# Patient Record
Sex: Female | Born: 2000 | Hispanic: Yes | Marital: Single | Smoking: Never smoker
Health system: Southern US, Community
[De-identification: ages and names within clinical notes are randomized; demographics above are authoritative.]

---
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
## 2018-05-14 ENCOUNTER — Emergency Department (HOSPITAL_COMMUNITY)
Admission: EM | Admit: 2018-05-14 | Discharge: 2018-05-15 | Disposition: A | Discharge status: Home / Self Care | Payer: Self-pay | Attending: Emergency Medicine | Admitting: Emergency Medicine

## 2018-05-14 ENCOUNTER — Encounter (HOSPITAL_COMMUNITY): Payer: Self-pay

## 2018-05-14 DIAGNOSIS — Y929 Unspecified place or not applicable: Secondary | ICD-10-CM | POA: Insufficient documentation

## 2018-05-14 DIAGNOSIS — Y999 Unspecified external cause status: Secondary | ICD-10-CM | POA: Insufficient documentation

## 2018-05-14 DIAGNOSIS — S06340A Traumatic hemorrhage of right cerebrum without loss of consciousness, initial encounter: Secondary | ICD-10-CM | POA: Insufficient documentation

## 2018-05-14 DIAGNOSIS — X58XXXA Exposure to other specified factors, initial encounter: Secondary | ICD-10-CM | POA: Insufficient documentation

## 2018-05-14 DIAGNOSIS — Y939 Activity, unspecified: Secondary | ICD-10-CM | POA: Insufficient documentation

## 2018-05-14 DIAGNOSIS — IMO0001 Reserved for inherently not codable concepts without codable children: Secondary | ICD-10-CM

## 2018-05-14 NOTE — ED Triage Notes (Signed)
Onset 3 days headache, decreased appetite. Pt has been taking Tylenol and Ibuprofen with no relief.  Sensitive to light.

## 2018-05-15 ENCOUNTER — Emergency Department (HOSPITAL_COMMUNITY): Payer: Self-pay

## 2018-05-15 LAB — CBC WITH DIFFERENTIAL/PLATELET
Abs Immature Granulocytes: 0.03 10*3/uL (ref 0.00–0.07)
Basophils Absolute: 0 10*3/uL (ref 0.0–0.1)
Basophils Relative: 0 %
Eosinophils Absolute: 0 10*3/uL (ref 0.0–0.5)
Eosinophils Relative: 0 %
HCT: 36.2 % (ref 36.0–46.0)
Hemoglobin: 11.4 g/dL — ABNORMAL LOW (ref 12.0–15.0)
Immature Granulocytes: 0 %
Lymphocytes Relative: 24 %
Lymphs Abs: 2 10*3/uL (ref 0.7–4.0)
MCH: 25.9 pg — ABNORMAL LOW (ref 26.0–34.0)
MCHC: 31.5 g/dL (ref 30.0–36.0)
MCV: 82.3 fL (ref 80.0–100.0)
Monocytes Absolute: 0.6 10*3/uL (ref 0.1–1.0)
Monocytes Relative: 7 %
Neutro Abs: 5.8 10*3/uL (ref 1.7–7.7)
Neutrophils Relative %: 69 %
Platelets: 287 10*3/uL (ref 150–400)
RBC: 4.4 MIL/uL (ref 3.87–5.11)
RDW: 13.1 % (ref 11.5–15.5)
WBC: 8.4 10*3/uL (ref 4.0–10.5)
nRBC: 0 % (ref 0.0–0.2)

## 2018-05-15 LAB — BASIC METABOLIC PANEL
Anion gap: 7 (ref 5–15)
BUN: 7 mg/dL (ref 6–20)
CO2: 20 mmol/L — ABNORMAL LOW (ref 22–32)
Calcium: 8.5 mg/dL — ABNORMAL LOW (ref 8.9–10.3)
Chloride: 109 mmol/L (ref 98–111)
Creatinine, Ser: 0.62 mg/dL (ref 0.44–1.00)
GFR calc Af Amer: >60 mL/min (ref >60)
GFR calc non Af Amer: >60 mL/min (ref >60)
Glucose, Bld: 97 mg/dL (ref 70–99)
Potassium: 3.5 mmol/L (ref 3.5–5.1)
Sodium: 136 mmol/L (ref 135–145)

## 2018-05-15 MED ORDER — IOPAMIDOL (ISOVUE-370) INJECTION 76%
50.0000 mL | Freq: Once | INTRAVENOUS | Status: AC | PRN
  Administered 2018-05-15: 50 mL via INTRAVENOUS

## 2018-05-15 MED ORDER — DIPHENHYDRAMINE HCL 50 MG/ML IJ SOLN
25.0000 mg | Freq: Once | INTRAMUSCULAR | Status: AC
  Administered 2018-05-15: 25 mg via INTRAVENOUS
  Filled 2018-05-15: qty 1

## 2018-05-15 MED ORDER — SODIUM CHLORIDE 0.9 % IV BOLUS
1000.0000 mL | Freq: Once | INTRAVENOUS | Status: AC
  Administered 2018-05-15: 1000 mL via INTRAVENOUS

## 2018-05-15 MED ORDER — IOPAMIDOL (ISOVUE-370) INJECTION 76%
INTRAVENOUS | Status: AC
  Filled 2018-05-15: qty 50

## 2018-05-15 MED ORDER — METOCLOPRAMIDE HCL 5 MG/ML IJ SOLN
10.0000 mg | Freq: Once | INTRAMUSCULAR | Status: AC
  Administered 2018-05-15: 10 mg via INTRAVENOUS
  Filled 2018-05-15: qty 2

## 2018-05-15 MED ORDER — KETOROLAC TROMETHAMINE 15 MG/ML IJ SOLN
15.0000 mg | Freq: Once | INTRAMUSCULAR | Status: AC
  Administered 2018-05-15: 15 mg via INTRAVENOUS
  Filled 2018-05-15: qty 1

## 2018-05-15 NOTE — ED Notes (Signed)
Pt returned from CT, reports HA continues to relieved.

## 2018-05-15 NOTE — ED Provider Notes (Signed)
[facility] EMERGENCY DEPARTMENT Provider Note   CSN: [id number] Arrival date & time: 05/14/18  1649     History   Chief Complaint Chief Complaint  Patient presents with  . Headache    HPI Emma Morrow is a 18 y.o. female.  HPI   18 year old female with headache.  Gradual onset Wednesday night when driving the car back from [place].  Describes pain in the frontal region.  Does not lateralize.  Has waxed and waned since onset without appreciable exacerbating relieving factors.  Associated with nausea.  She has vomited a few times.  Photophobia.  No fevers or chills.  No neck pain or neck stiffness.  She has tried Tylenol and ibuprofen without improvement.  She is otherwise healthy.  She denies any similar headache history.  She was recently started on OCPs.  History reviewed. No pertinent past medical history.  There are no active problems to display for this patient.   History reviewed. No pertinent surgical history.   OB History   No obstetric history on file.      Home Medications    Prior to Admission medications   Not on File    Family History History reviewed. No pertinent family history.  Social History Social History   Tobacco Use  . Smoking status: Never Smoker  Substance Use Topics  . Alcohol use: No  . Drug use: Not on file     Allergies   Patient has no known allergies.   Review of Systems Review of Systems  All systems reviewed and negative, other than as noted in HPI.  Physical Exam Updated Vital Signs BP 113/73   Pulse 87   Temp 98.2 F (36.8 C) (Oral)   Resp 18   LMP 05/07/2018   SpO2 99%   Physical Exam Vitals signs and nursing note reviewed.  Constitutional:      General: She is not in acute distress.    Appearance: She is well-developed.  HENT:     Head: Normocephalic and atraumatic.  Eyes:     General: No visual field deficit.       Right eye: No discharge.        Left eye: No discharge.   Extraocular Movements: Extraocular movements intact.     Conjunctiva/sclera: Conjunctivae normal.     Pupils: Pupils are equal, round, and reactive to light.  Neck:     Musculoskeletal: Normal range of motion and neck supple. No neck rigidity.     Meningeal: Brudzinski's sign and Kernig's sign absent.  Cardiovascular:     Rate and Rhythm: Normal rate and regular rhythm.     Heart sounds: Normal heart sounds. No murmur. No friction rub. No gallop.   Pulmonary:     Effort: Pulmonary effort is normal. No respiratory distress.     Breath sounds: Normal breath sounds.  Abdominal:     General: There is no distension.     Palpations: Abdomen is soft.     Tenderness: There is no abdominal tenderness.  Musculoskeletal:        General: No tenderness.  Lymphadenopathy:     Cervical: No cervical adenopathy.  Skin:    General: Skin is warm and dry.  Neurological:     Mental Status: She is alert.     Cranial Nerves: No cranial nerve deficit, dysarthria or facial asymmetry.     Sensory: No sensory deficit.     Motor: No weakness.     Coordination: Coordination normal.  Gait: Gait normal.  Psychiatric:        Mood and Affect: Mood normal.        Speech: Speech normal.        Behavior: Behavior normal.        Thought Content: Thought content normal.      ED Treatments / Results  Labs (all labs ordered are listed, but only abnormal results are displayed) Labs Reviewed - No data to display  EKG None  Radiology Ct Angio Head W Or Wo Contrast  Result Date: 05/15/2018 CLINICAL DATA:  Follow-up examination for acute intracranial hemorrhage. EXAM: CT ANGIOGRAPHY HEAD TECHNIQUE: Multidetector CT imaging of the head was performed using the standard protocol during bolus administration of intravenous contrast. Multiplanar CT image reconstructions and MIPs were obtained to evaluate the vascular anatomy. CONTRAST:  50mL ISOVUE-370 IOPAMIDOL (ISOVUE-370) INJECTION 76% COMPARISON:  Prior CT from  earlier the same day. FINDINGS: CTA HEAD Anterior circulation: Visualized distal cervical segments of the internal carotid arteries are well opacified and widely patent. Petrous, cavernous, and supraclinoid segments widely patent without stenosis or other abnormality. ICA termini well perfused and symmetric. A1 segments, anterior communicating artery common anterior cerebral arteries widely patent bilaterally. M1 segments widely patent and normal. Normal MCA bifurcations. Distal MCA branches well perfused and symmetric. Posterior circulation: Vertebral arteries widely patent to the vertebrobasilar junction without stenosis. Left vertebral artery slightly dominant. Patent left PICA. Right PICA not visualized. Basilar widely patent to its distal aspect without stenosis. Superior cerebral arteries patent proximally. Both of the posterior cerebral arteries primarily supplied via the basilar and are well perfused to their distal aspects. Venous sinuses: Grossly patent, although not well assessed due to arterial timing the contrast bolus. Anatomic variants: None significant. Previously noted right cerebral hemorrhage again seen, stable in size and morphology. A small serpiginous vessel seen coursing through the mid aspect of this hemorrhage, favored to reflect a choroidal vessels/artery. Given this, disc bleed may in fact be intraventricular in location, and represent a focal hemorrhage related to the choroid plexus. No other AVM or other vascular abnormality identified within this region. Delayed phase: No abnormal enhancement. No mass lesion seen underlying the right-sided intracerebral hemorrhage. IMPRESSION: 1. Stable size and appearance of right-sided intracerebral hemorrhage. Small serpiginous vessel seen coursing through the mid aspect of this hemorrhage favored to reflect a choroidal vessel. Given this and upon further consideration, this bleed may in fact be intraventricular in location and associated with the  choroid plexus, difficult to be certain given at the right lateral ventricle is largely effaced at this level. Further assessment with dedicated MRI would likely be helpful for further delineation. No other AVM or other vascular abnormality seen underlying this bleed. 2. Otherwise unremarkable and normal CTA of the head. Electronically Signed   By: Rise MuBenjamin  McClintock M.D.   On: 05/15/2018 03:37   Ct Head Wo Contrast  Result Date: 05/15/2018 CLINICAL DATA:  Initial evaluation for acute severe headache. EXAM: CT HEAD WITHOUT CONTRAST TECHNIQUE: Contiguous axial images were obtained from the base of the skull through the vertex without intravenous contrast. COMPARISON:  None available. FINDINGS: Brain: Acute intraparenchymal hemorrhage centered at the right posterior periventricular white matter adjacent to the right lateral ventricle measures approximately 1.2 x 2.2 x 2.7 cm (estimated volume 3.6 cc). Minimal localized vasogenic edema without significant regional mass effect. No appreciable intraventricular extension. Slight asymmetric dilatation of the adjacent temporal horn of the right lateral ventricle suspicious for possible mild and/or early ventricular trapping (series  3, image 11). No hydrocephalus or midline shift. Basilar cisterns remain patent. No other acute intracranial hemorrhage. No acute large vessel territory infarct. No mass lesion identified. No extra-axial fluid collection. Vascular: No hyperdense vessel. Skull: Scalp soft tissues and calvarium within normal limits. Sinuses/Orbits: Globes and orbital soft tissues within normal limits. Left frontal sinus largely opacified. Mucosal thickening and opacity noted within the ethmoidal air cells anteriorly. Mild circumferential mucosal thickening noted within the right maxillary sinus as well. Mastoid air cells and middle ear cavities are well pneumatized and free of fluid. Other: None IMPRESSION: 1. 1.2 x 2.2 x 2.7 cm (estimated volume 3.6 cc) acute  intraparenchymal hemorrhage centered at the right posterior periventricular white matter adjacent to the right lateral ventricle without significant regional mass effect. 2. Slight asymmetric dilatation of the adjacent temporal horn of the right lateral ventricle suspicious for mild and/or early ventricular trapping. 3. Paranasal sinus disease as above. Critical Value/emergent results were called by telephone at the time of interpretation on 05/15/2018 at 1:25 am to Dr. Raeford Razor , who verbally acknowledged these results. Electronically Signed   By: Rise Mu M.D.   On: 05/15/2018 01:28   Mr Brain Wo Contrast  Result Date: 05/15/2018 CLINICAL DATA:  Follow-up examination for acute headache, intracranial hemorrhage. EXAM: MRI HEAD WITHOUT CONTRAST TECHNIQUE: Multiplanar, multiecho pulse sequences of the brain and surrounding structures were obtained without intravenous contrast. COMPARISON:  Prior CT and CTA from earlier the same day. FINDINGS: Brain: Examination technically limited by susceptibility artifact from dental hardware. Additionally, examination somewhat degraded by motion. Previously identified right cerebral hemorrhage again seen. Hemorrhage is definitely intraventricular in location, likely rising from the choroid plexus as previously postulated on prior CTA. Overall size of the hemorrhage not significantly changed as compared to previous exams. Mild surrounding FLAIR signal abnormality suggestive of edema and/or localized transependymal flow of CSF without significant regional mass effect. Mild dilatation of the temporal horn of the right lateral ventricle suggestive of mild ventricular trapping. Ventricular size otherwise within normal limits without hydrocephalus. Basilar cisterns remain patent. Remainder of the visualized brain is normal in appearance. No definite evidence for acute infarct on this limited examination. No encephalomalacia to suggest chronic infarction. No other  appreciable evidence for acute or chronic hemorrhage seen on SWI sequence, although evaluation limited by susceptibility artifact. No mass lesion, midline shift, or significant mass effect. No appreciable extra-axial fluid collection. Pituitary gland within normal limits for age. Midline structures intact and normal. Vascular: Major intracranial vascular flow voids maintained. Skull and upper cervical spine: Craniocervical junction within normal limits. Upper cervical spine unremarkable. Bone marrow signal intensity within normal limits for age. No scalp soft tissue abnormality. Sinuses/Orbits: Evaluation of the globes, orbital soft tissues, and paranasal sinuses limited by susceptibility artifact. Previously noted left frontoethmoidal sinus disease partially visualized. No mastoid effusion. Inner ear structures grossly normal. Other: None. IMPRESSION: 1. Previously identified right cerebral hemorrhage is intraventricular in location, likely arising from the choroid plexus at the posterior right lateral ventricle. Mild localized edema without significant regional mass effect. Mild dilatation of the temporal horn of the right lateral ventricle consistent with mild ventricular trapping. No hydrocephalus. 2. Otherwise normal brain MRI for age. Please note examination is somewhat technically limited by susceptibility artifact from dental hardware. Electronically Signed   By: Rise Mu M.D.   On: 05/15/2018 06:33    Procedures Procedures (including critical care time)  Medications Ordered in ED Medications  sodium chloride 0.9 % bolus 1,000 mL (has  no administration in time range)  ketorolac (TORADOL) 15 MG/ML injection 15 mg (has no administration in time range)  metoCLOPramide (REGLAN) injection 10 mg (has no administration in time range)  diphenhydrAMINE (BENADRYL) injection 25 mg (has no administration in time range)     Initial Impression / Assessment and Plan / ED Course  I have reviewed  the triage vital signs and the nursing notes.  Pertinent labs & imaging results that were available during my care of the patient were reviewed by me and considered in my medical decision making (see chart for details).     18 year old female with isolated intraventricular hemorrhage.  She has been having symptoms for several days.  Her neuro exam is nonfocal.  The headache is now completely resolved.  Further imaging without evidence of aneurysm or obvious explanation for the hemorrhage.  Discussed the case with Dr. Venetia MaxonStern, neurosurgery.  At this point, he feels that she is safe for discharge.  Recommended follow-up with Dr. Conchita ParisNundkumar this week.  Very strict return precautions were discussed in detail with patient.  Final Clinical Impressions(s) / ED Diagnoses   Final diagnoses:  Intracranial hematoma, right, without loss of consciousness, initial encounter [facility](HCC)    ED Discharge Orders    None       Raeford RazorKohut, Mendel Binsfeld, MD 05/15/18 [phone]

## 2018-05-15 NOTE — ED Notes (Signed)
Pt to MRI via stretcher.

## 2018-05-15 NOTE — ED Notes (Signed)
Pt in CT at this time.

## 2018-05-15 NOTE — ED Notes (Signed)
Pt denies pain, neuro intact

## 2018-05-15 NOTE — ED Notes (Signed)
Pt returned from MRI °

## 2018-05-25 ENCOUNTER — Other Ambulatory Visit (HOSPITAL_COMMUNITY): Payer: Self-pay | Admitting: Neurosurgery

## 2018-05-25 DIAGNOSIS — I615 Nontraumatic intracerebral hemorrhage, intraventricular: Secondary | ICD-10-CM

## 2018-05-30 ENCOUNTER — Other Ambulatory Visit: Payer: Self-pay | Admitting: Neurosurgery

## 2018-06-09 ENCOUNTER — Other Ambulatory Visit: Payer: Self-pay | Admitting: Radiology

## 2018-06-09 NOTE — H&P (Signed)
Chief Complaint   spontaneous IVH  HPI   HPI: Emma Morrow is a 18 y.o. female  who presented to the emergency room earlier this month due to acute onset headache, nausea, photophobia and phonophobia 6 days prior to ED visit.  Her symptoms had actually resolved by the time she was at the emergency room but she ultimately underwent a CT scan and MRI which demonstrated spontaneous intraventricular hemorrhage without any identifiable cause.  She presents today for diagnostic angiogram for further workup to rule out any vascular malformation.  She is without any concerns today.  There are no active problems to display for this patient.   PMH: No past medical history on file.  PSH: No past surgical history on file.  (Not in a hospital admission)   SH: Social History   Tobacco Use  . Smoking status: Never Smoker  Substance Use Topics  . Alcohol use: No  . Drug use: Not on file    MEDS: Prior to Admission medications   Medication Sig Start Date End Date Taking? Authorizing Provider  levonorgestrel-ethinyl estradiol (ALTAVERA) 0.15-30 MG-MCG tablet Take 1 tablet by mouth daily.    [provider]    ALLERGY: No Known Allergies  Social History   Tobacco Use  . Smoking status: Never Smoker  Substance Use Topics  . Alcohol use: No     No family history on file.   ROS   ROS  Exam   There were no vitals filed for this visit. General appearance: WDWN, NAD Eyes: No scleral injection Cardiovascular: Regular rate and rhythm without murmurs, rubs, gallops. No edema or variciosities. Distal pulses normal. Pulmonary: Effort normal, non-labored breathing Musculoskeletal:     Muscle tone upper extremities: Normal    Muscle tone lower extremities: Normal    Motor exam: Upper Extremities Deltoid Bicep Tricep Grip  Right 5/5 5/5 5/5 5/5  Left 5/5 5/5 5/5 5/5   Lower Extremity IP Quad PF DF EHL  Right 5/5 5/5 5/5 5/5 5/5  Left 5/5 5/5 5/5 5/5 5/5    Neurological Mental Status:    - Patient is awake, alert, oriented to person, place, month, year, and situation    - Patient is able to give a clear and coherent history.    - No signs of aphasia or neglect Cranial Nerves    - II: Visual Fields are full. PERRL    - III/IV/VI: EOMI without ptosis or diploplia.     - V: Facial sensation is grossly normal    - VII: Facial movement is symmetric.     - VIII: hearing is intact to voice    - X: Uvula elevates symmetrically    - XI: Shoulder shrug is symmetric.    - XII: tongue is midline without atrophy or fasciculations.  Sensory: Sensation grossly intact to LT  Results - Imaging/Labs   No results found for this or any previous visit (from the past 48 hour(s)).  No results found.  IMAGING: CT of the brain without contrast dated 05/15/2018 was reviewed.  This demonstrates right-sided hemorrhage within the right lateral ventricle without hydrocephalus.  CT angiogram dated 05/15/2018 was also reviewed.  This does not demonstrate any intracranial aneurysms or obvious arteriovenous malformations.  MRI of the brain dated 05/15/2018 was also reviewed.  This again demonstrates the hemorrhage confined primarily to the right-sided lateral ventricle likely within the choroid plexus.  There is perhaps mild dilatation of the right temporal horn but no overt hydrocephalus.  No  mass lesions are identified.   Impression/Plan   18 y.o. female without any significant risk factors with spontaneous intraventricular hemorrhage.    She presents today for diagnostic cerebral angiogram for further workup and to rule out underlying vascular malformation.  While in the office risk, benefits and alternatives to the procedure were discussed.  Patient stated in own language understanding and wished to proceed.  All questions answered.

## 2018-06-10 ENCOUNTER — Encounter (HOSPITAL_COMMUNITY): Payer: Self-pay | Admitting: Neurosurgery

## 2018-06-10 ENCOUNTER — Other Ambulatory Visit: Payer: Self-pay

## 2018-06-10 ENCOUNTER — Other Ambulatory Visit (HOSPITAL_COMMUNITY): Payer: Self-pay | Admitting: Neurosurgery

## 2018-06-10 ENCOUNTER — Ambulatory Visit (HOSPITAL_COMMUNITY)
Admission: RE | Admit: 2018-06-10 | Discharge: 2018-06-10 | Disposition: A | Discharge status: Home / Self Care | Payer: Self-pay | Source: Ambulatory Visit | Attending: Neurosurgery | Admitting: Neurosurgery

## 2018-06-10 DIAGNOSIS — I615 Nontraumatic intracerebral hemorrhage, intraventricular: Secondary | ICD-10-CM | POA: Insufficient documentation

## 2018-06-10 DIAGNOSIS — Z793 Long term (current) use of hormonal contraceptives: Secondary | ICD-10-CM | POA: Insufficient documentation

## 2018-06-10 HISTORY — PX: IR ANGIO INTRA EXTRACRAN SEL INTERNAL CAROTID BILAT MOD SED: IMG5363

## 2018-06-10 HISTORY — PX: IR ANGIO VERTEBRAL SEL VERTEBRAL BILAT MOD SED: IMG5369

## 2018-06-10 HISTORY — PX: IR US GUIDE VASC ACCESS RIGHT: IMG2390

## 2018-06-10 LAB — CBC WITH DIFFERENTIAL/PLATELET
Abs Immature Granulocytes: 0.02 10*3/uL (ref 0.00–0.07)
Basophils Absolute: 0 10*3/uL (ref 0.0–0.1)
Basophils Relative: 0 %
Eosinophils Absolute: 0 10*3/uL (ref 0.0–0.5)
Eosinophils Relative: 0 %
HCT: 43.1 % (ref 36.0–46.0)
Hemoglobin: 13.9 g/dL (ref 12.0–15.0)
Immature Granulocytes: 0 %
Lymphocytes Relative: 17 %
Lymphs Abs: 1.3 10*3/uL (ref 0.7–4.0)
MCH: 26.7 pg (ref 26.0–34.0)
MCHC: 32.3 g/dL (ref 30.0–36.0)
MCV: 82.7 fL (ref 80.0–100.0)
Monocytes Absolute: 0.4 10*3/uL (ref 0.1–1.0)
Monocytes Relative: 5 %
NEUTROS ABS: 5.7 10*3/uL (ref 1.7–7.7)
Neutrophils Relative %: 78 %
Platelets: 221 10*3/uL (ref 150–400)
RBC: 5.21 MIL/uL — AB (ref 3.87–5.11)
RDW: 13.3 % (ref 11.5–15.5)
WBC: 7.5 10*3/uL (ref 4.0–10.5)
nRBC: 0 % (ref 0.0–0.2)

## 2018-06-10 LAB — BASIC METABOLIC PANEL
ANION GAP: 8 (ref 5–15)
BUN: 7 mg/dL (ref 6–20)
CO2: 21 mmol/L — ABNORMAL LOW (ref 22–32)
Calcium: 9.5 mg/dL (ref 8.9–10.3)
Chloride: 111 mmol/L (ref 98–111)
Creatinine, Ser: 0.66 mg/dL (ref 0.44–1.00)
GFR calc Af Amer: >60 mL/min (ref >60)
GFR calc non Af Amer: >60 mL/min (ref >60)
Glucose, Bld: 91 mg/dL (ref 70–99)
POTASSIUM: 3.9 mmol/L (ref 3.5–5.1)
Sodium: 140 mmol/L (ref 135–145)

## 2018-06-10 LAB — PROTIME-INR
INR: 1.06
Prothrombin Time: 13.7 seconds (ref 11.4–15.2)

## 2018-06-10 LAB — APTT: aPTT: 31 seconds (ref 24–36)

## 2018-06-10 LAB — PREGNANCY, URINE: PREG TEST UR: NEGATIVE

## 2018-06-10 MED ORDER — FENTANYL CITRATE (PF) 100 MCG/2ML IJ SOLN
INTRAMUSCULAR | Status: AC
  Filled 2018-06-10: qty 2

## 2018-06-10 MED ORDER — CEFAZOLIN SODIUM-DEXTROSE 2-4 GM/100ML-% IV SOLN: 2.0000 g | INTRAVENOUS | Status: DC

## 2018-06-10 MED ORDER — IOPAMIDOL (ISOVUE-300) INJECTION 61%
INTRAVENOUS | Status: AC
  Filled 2018-06-10: qty 100

## 2018-06-10 MED ORDER — HEPARIN SODIUM (PORCINE) 1000 UNIT/ML IJ SOLN
INTRAMUSCULAR | Status: AC
  Filled 2018-06-10: qty 1

## 2018-06-10 MED ORDER — MIDAZOLAM HCL 2 MG/2ML IJ SOLN
INTRAMUSCULAR | Status: AC
  Filled 2018-06-10: qty 2

## 2018-06-10 MED ORDER — LIDOCAINE HCL (PF) 1 % IJ SOLN
INTRAMUSCULAR | Status: AC | PRN
  Administered 2018-06-10: 10 mL

## 2018-06-10 MED ORDER — LIDOCAINE HCL 1 % IJ SOLN
INTRAMUSCULAR | Status: AC
  Filled 2018-06-10: qty 20

## 2018-06-10 MED ORDER — MIDAZOLAM HCL 2 MG/2ML IJ SOLN
INTRAMUSCULAR | Status: AC | PRN
  Administered 2018-06-10: 1 mg via INTRAVENOUS

## 2018-06-10 MED ORDER — NITROGLYCERIN 1 MG/10 ML FOR IR/CATH LAB
INTRA_ARTERIAL | Status: AC
  Filled 2018-06-10: qty 10

## 2018-06-10 MED ORDER — CHLORHEXIDINE GLUCONATE CLOTH 2 % EX PADS: 6.0000 | MEDICATED_PAD | Freq: Once | CUTANEOUS | Status: DC

## 2018-06-10 MED ORDER — FENTANYL CITRATE (PF) 100 MCG/2ML IJ SOLN
INTRAMUSCULAR | Status: AC | PRN
  Administered 2018-06-10: 25 ug via INTRAVENOUS

## 2018-06-10 MED ORDER — HEPARIN SODIUM (PORCINE) 1000 UNIT/ML IJ SOLN
INTRAMUSCULAR | Status: AC | PRN
  Administered 2018-06-10: 3000 [IU] via INTRAVENOUS

## 2018-06-10 MED ORDER — VERAPAMIL HCL 2.5 MG/ML IV SOLN
INTRAVENOUS | Status: AC
  Filled 2018-06-10: qty 2

## 2018-06-10 NOTE — Sedation Documentation (Signed)
No hematoma, bleeding at right groin site.

## 2018-06-10 NOTE — Sedation Documentation (Addendum)
3cc air removed from TR Band, per order.  No bleeding noted.  89ml air remains.  Will cont to monitor

## 2018-06-10 NOTE — Discharge Instructions (Addendum)
Cerebral Angiogram, Care After  This sheet gives you information about how to care for yourself after your procedure. Your health care provider may also give you more specific instructions. If you have problems or questions, contact your health care provider.  What can I expect after the procedure?  After your procedure, it is common to have:  · Bruising and tenderness at the catheter insertion site.  · A mild headache.  Follow these instructions at home:  Insertion site care    · Follow instructions from your health care provider about how to take care of the insertion site. Make sure you:  ? Wash your hands with soap and water before you change your bandage (dressing). If soap and water are not available, use hand sanitizer.  ? Change your dressing as told by your health care provider.  ? Leave stitches (sutures), skin glue, or adhesive strips in place. These skin closures may need to stay in place for 2 weeks or longer. If adhesive strip edges start to loosen and curl up, you may trim the loose edges. Do not remove adhesive strips completely unless your health care provider tells you to do that.  · Do not apply powder or lotion to the site.  · Do not take baths, swim, or use a hot tub until your health care provider says it is okay to do so.  · You may shower 24-48 hours after the procedure or as told by your health care provider.  ? Gently wash the site with plain soap and water.  ? Pat the area dry with a clean towel.  ? Do not rub the site. This may cause bleeding.  · Check your incision area every day for signs of infection. Check for:  ? Redness, swelling, or pain.  ? Fluid or blood.  ? Warmth.  ? Pus or a bad smell.  Activity  · Rest as told by your health care provider.  · Do not lift anything that is heavier than 10 lb (4.5 kg), or the limit that your health care provider tells you, until he or she says that it is safe.  · Do not drive for 24 hours if you were given a medicine to help you relax  (sedative).  · Do not drive or use heavy machinery while taking prescription pain medicine.  General instructions    · Return to your normal activities as told by your health care provider. Ask your health care provider what activities are safe for you.  · If the catheter site starts to bleed, lie flat and put pressure on the site.  · Drink enough fluid to keep your urine clear or pale yellow. This helps flush the contrast dye from your body.  · Take over-the-counter and prescription medicines only as told by your health care provider.  · Keep all follow-up visits as directed by your health care provider. This is important.  Contact a health care provider if:  · You have a fever or chills.  · You have redness, swelling, or more pain around your insertion site.  · You have fluid or blood coming from your insertion site.  · The insertion site feels warm to the touch.  · You have pus or a bad smell coming from your insertion site.  · You have more bruising around the insertion site.  · Blood collects in the tissue around the catheter site (hematoma), and the hematoma may be painful to the touch.  Get help right away   if:  · You have vision changes or loss of vision.  · The catheter insertion area swells very fast.  · You have numbness or weakness on one side of your body.  · You have trouble talking, or you have slurred speech or cannot speak (aphasia).  · You feel confused or have trouble remembering.  · You have severe pain at the catheter insertion area.  · The catheter insertion area is bleeding, and the bleeding does not stop after 30 minutes of holding steady pressure on the site.  · The arm or leg where the catheter was inserted is numb, tingling, or cold.  · You have chest pain.  · You have trouble breathing.  · You have a rash.  · You have trouble using the arm or leg where the catheter was inserted.  These symptoms may represent a serious problem that is an emergency. Do not wait to see if the symptoms will go  away. Get medical help right away. Call your local emergency services (911 in [place] Do not drive yourself to the hospital.  Summary  · After your procedure, it is common to have bruising and tenderness at the site where the catheter was inserted.  · If your catheter insertion site begins to bleed, put direct pressure on it until the bleeding stops.  · After your procedure, it is important to rest and drink plenty of fluids.  · Return to your normal activities as told by your health care provider. Ask your health care provider what activities are safe for you.  This information is not intended to replace advice given to you by your health care provider. Make sure you discuss any questions you have with your health care provider.  Document Released: 09/11/2013 Document Revised: 06/01/2016 Document Reviewed: 06/01/2016  Elsevier Interactive Patient Education © 2019 [employer]

## 2018-06-10 NOTE — Sedation Documentation (Signed)
TR Band inflated at 1110 with 13cc air

## 2018-06-10 NOTE — Sedation Documentation (Signed)
7ml air removed from TR Band, per order.  No bleeding noted at site.  39ml air remains in TR Band.  Will cont to monitor

## 2018-06-10 NOTE — Progress Notes (Signed)
Patients friend, Patsy Lager will be picking up patient.  Yolanda's phone number: [phone]

## 2018-06-10 NOTE — Sedation Documentation (Addendum)
Patient transferred to [facility] bed 13 via log roll and slide with 4 persons.  Pt tolerated transfer without incident.  Bedside report given to [facility] RN, groin site assesed, RLE pulses assessed, T RBand assesed, notified RN that 58ml remain in TR band and next deflate per order is at 1211.  Pt care handed off to [facility].

## 2018-06-10 NOTE — Progress Notes (Signed)
Spoke with Amy RN in Short Stay.  Notified that 34ml air has been released from TR Band at 1141, TR band applied at 1111 with 69ml.  Now at 43ml, no bleeding, next release of air per order is at 1156.  Notified of right groin site, assessment and pulse checks.  Pt to go to bed 13

## 2018-06-10 NOTE — Sedation Documentation (Signed)
Exoseal to right groin puncture site completed by IR Rad Tech, pressure held

## 2018-06-10 NOTE — Sedation Documentation (Signed)
No hematoma, bleeding at right groin site.  Patient remains flat in bed

## 2018-06-10 NOTE — Sedation Documentation (Addendum)
No hematoma, bleeding at right groin site.  Patient remains flat in bed and has been educated on importance to remain flat and keep legs straight for two hours.  Pt provided teach back education on importance of legs remaining straight for 2 hrs.

## 2019-04-10 IMAGING — US IR CAROTID INTERNAL HEAD/NECK BILAT  (MS)
1 series · 3 of 3 positions shown · non-contrast
Comparison: none

PROCEDURE:
DIAGNOSTIC CEREBRAL ANGIOGRAM
HISTORY: The patient is a 18-year-old woman who was previously seen in the
hospital after suffering spontaneous primarily intraventricular
hemorrhage. She has made an excellent recovery and was seen in the
Outpatient neurosurgery clinic where further workup with diagnostic
cerebral angiogram was recommended. She therefore presents today for
the procedure.
TECHNIQUE: CATHETERS AND WIRES
5-French JB-1 catheter

[Series 1: ir carotid internal head/neck bilat (ms) · 3 of 3 slices shown]
[im 1/3]
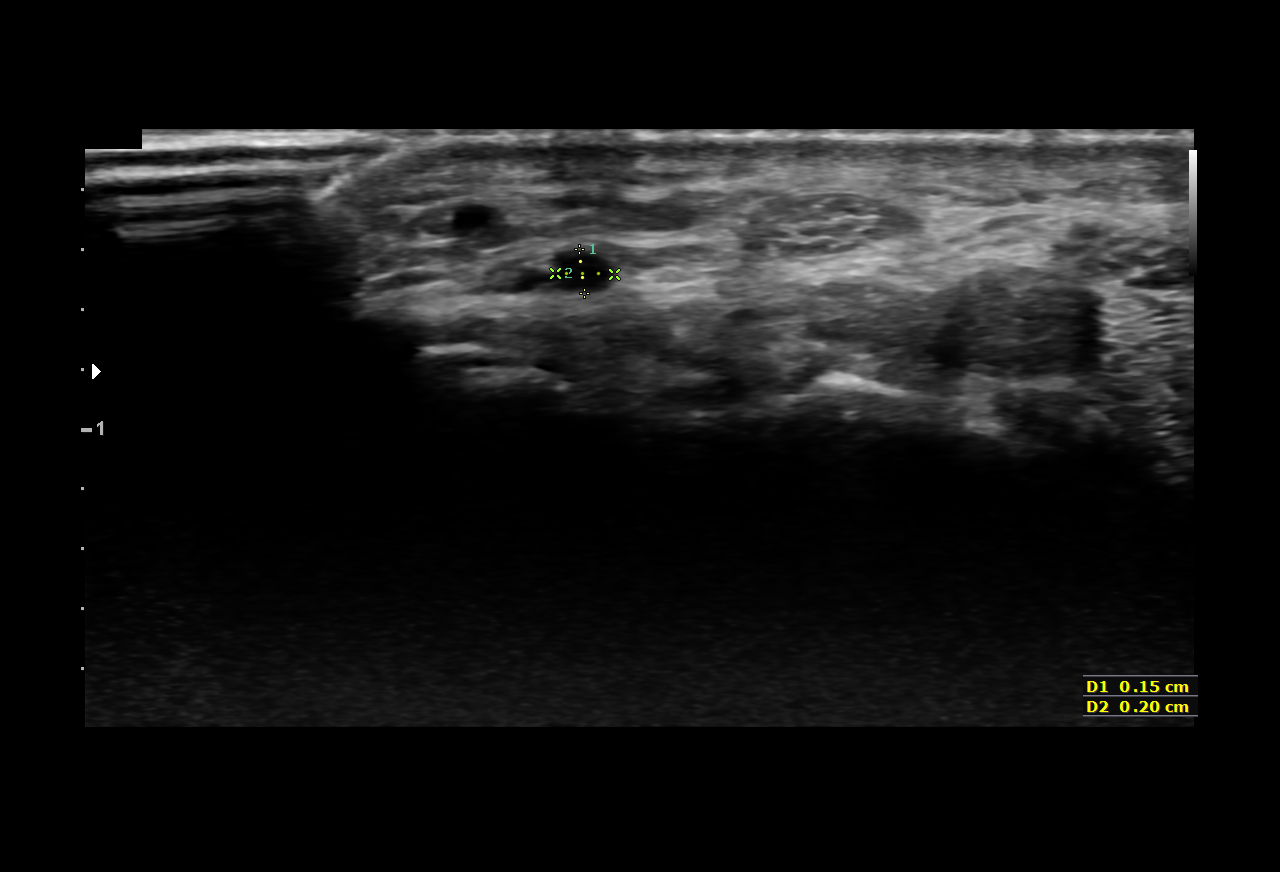
[im 2/3]
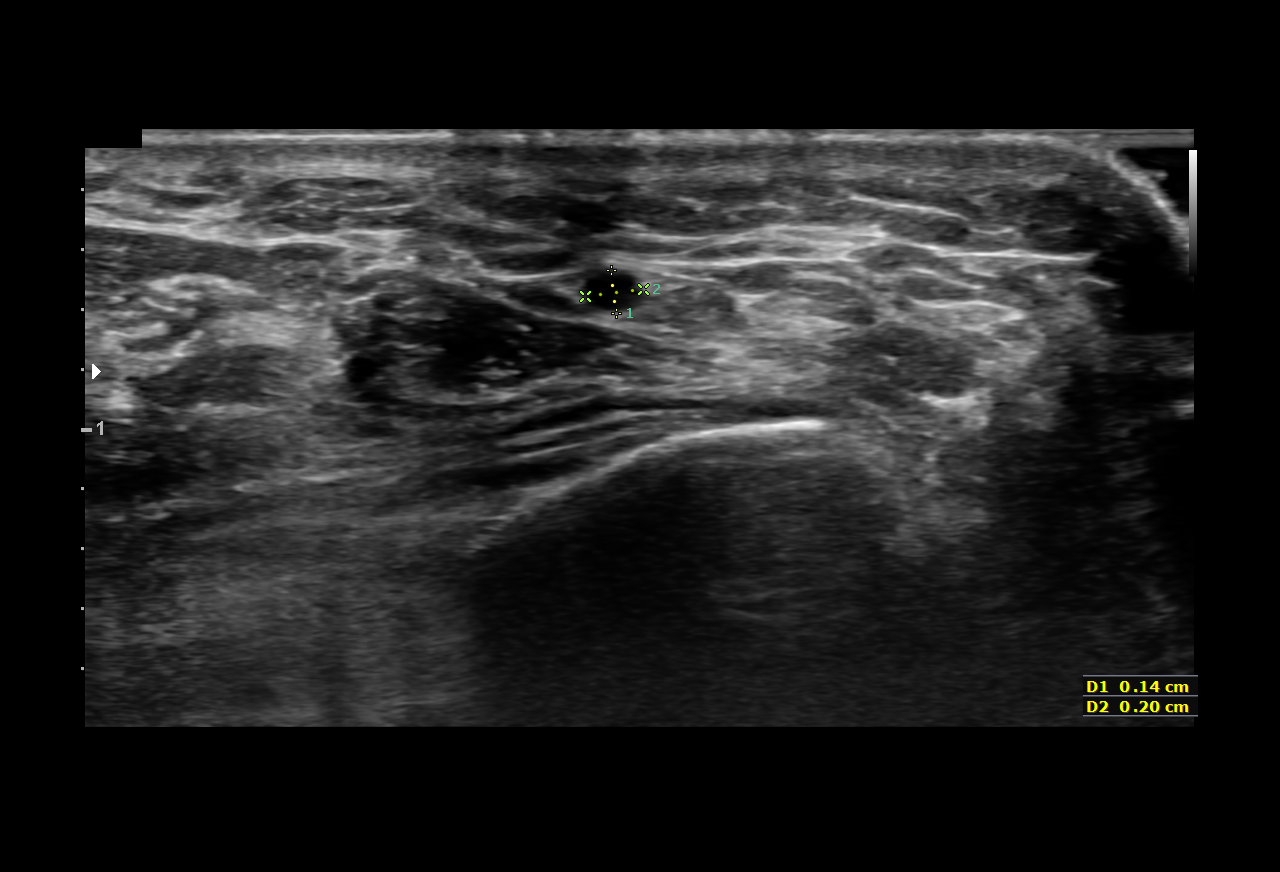
[im 3/3]
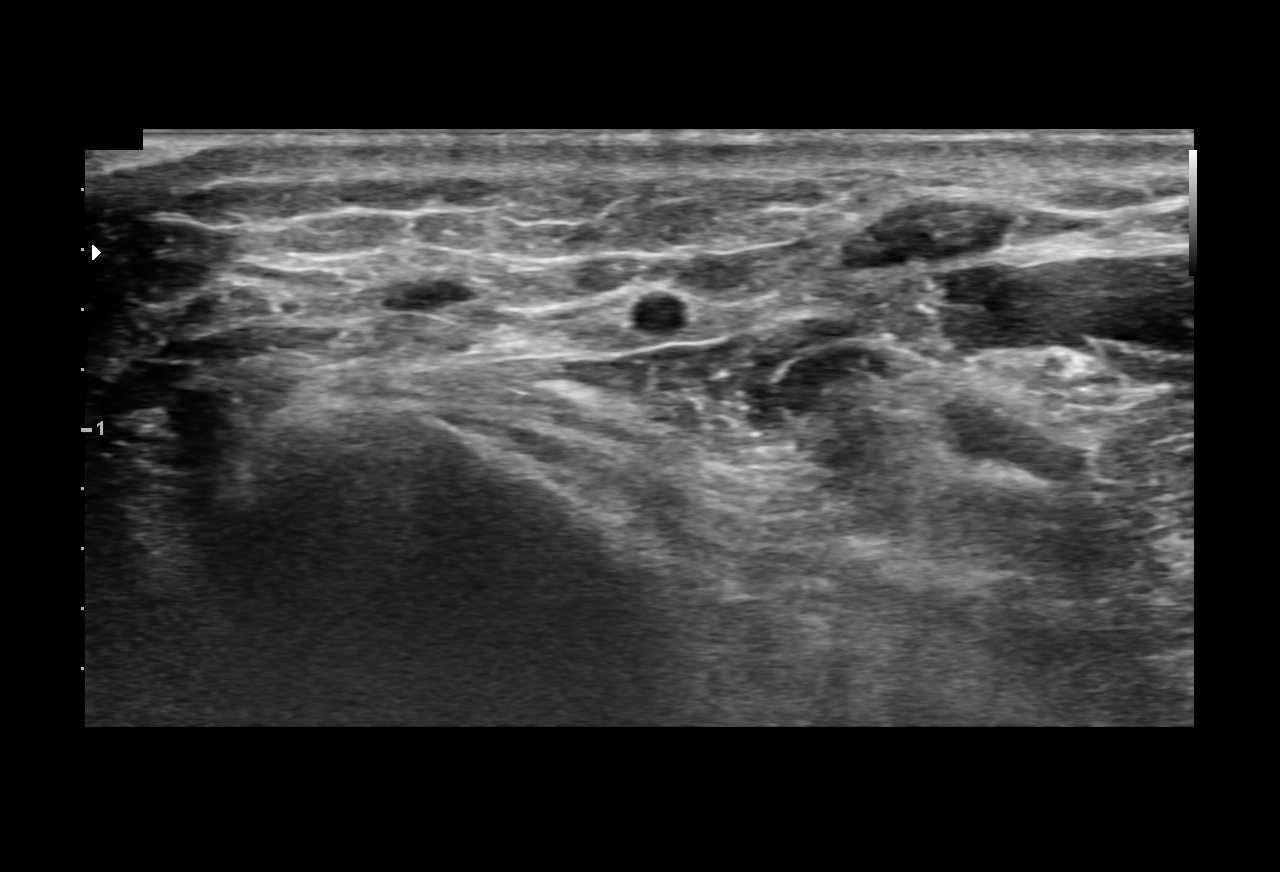

[3 of 3 positions shown; findings below may reference images not displayed]

ACCESS:
The technical aspects of the procedure as well as its potential
risks and benefits were reviewed with the patient. These risks
included but were not limited bleeding, infection, allergic
reaction, damage to organs or vital structures, stroke,
non-diagnostic procedure, and the catastrophic outcomes of heart
attack, coma, and death. With an understanding of these risks,
informed consent was obtained and witnessed. The patient was placed
in the supine position on the angiography table and the skin of
right groin prepped in the usual sterile fashion.

The procedure was performed under local anesthesia (1%-solution of
bicarbonate-buffered Lidocaine) and conscious sedation with 1mg
versed and 25 micrograms fentanyl monitored using continuous pulse
oximetry, heart rate, and intermittent noninvasive blood pressure.

A 5- French sheath was introduced in the right radial artery using
Seldinger technique. A fluoro-phase sequence was used to document
the sheath position.

MEDICATIONS:
HEPARIN: 5999 Units total.

VERAPAMIL: 2.5mg

NITROGLYCERIN: 400 micrograms

CONTRAST:  cc, Omnipaque 300

FLUOROSCOPY TIME:  FLUOROSCOPY TIME: See IR records
Five Jermaine Qi 2 glide catheter

Five French cook vertebral catheter

0.035" glidewire

VESSELS CATHETERIZED
Right internal carotid

Left internal carotid

Left vertebral

Right vertebral

Right common femoral

Right radial

VESSELS STUDIED
Right internal carotid, head

Left internal carotid, head

Left vertebral

Right vertebral

Right radial

Right femoral

PROCEDURAL NARRATIVE
The Hass catheter was introduced through the right radial artery.
The right radial artery was noted to be of somewhat diminutive
caliber. I was able to advance the catheter into the right
subclavian artery, and the right vertebral artery was catheterized
and angiogram taken. I was unable to easily catheterized the left or
right common carotid arteries. I therefore removed the Hass
catheter and introduce the vertebral catheter. There was some
resistance to advancement of the catheter in the midportion of the
forearm. The radial artery appeared to be quite small possibly in
spasm. I therefore elected to abort the trans radial procedure.
Access to the right common femoral artery was obtained using
ultrasound guidance, and a 5 French sheath was placed using standard
Seldinger technique. JB 1 glide catheter was then introduced over
the Glidewire into the descending aortic arch. The remainder of the
above vessels were then sequentially catheterized and cerebral
angiograms taken. The catheter was removed without incident. Femoral
angiogram was taken.
FINDINGS: Right internal carotid: head:

Injection reveals the presence of a widely patent ICA, M1, and A1
segments and their branches. No aneurysms, AVMs, or high-flow
fistulas are seen. The parenchymal and venous phases are normal. The
venous sinuses are widely patent.

Left internal carotid: head:

Injection reveals the presence of a widely patent ICA, A1, and M1
segments and their branches. No aneurysms, AVMs, or high-flow
fistulas are seen. The parenchymal and venous phases are normal. The
venous sinuses are widely patent.

Left vertebral:

Injection reveals the presence of a widely patent vertebral artery.
This leads to a widely patent basilar artery that terminates in
bilateral P1. The basilar apex is normal. No aneurysms, AVMs, or
high-flow fistulas are seen. The parenchymal and venous phases are
normal. The venous sinuses are widely patent.

Right vertebral:

The vertebral artery is widely patent. No PICA aneurysm is seen. See
basilar description above.

Right femoral:

Normal vessel. No significant atherosclerotic disease. Arterial
sheath in adequate position.

Right radial:

Arterial sheath is in good position. The radial artery is noted to
be relatively small, measuring <2.0mm.

DISPOSITION:
Upon completion of the study, the femoral sheath was removed and
hemostasis obtained using a 5-Fr ExoSeal closure device. Patent
hemostasis is also secured on the right radial artery with the
Terumo TR band. Good proximal and distal lower extremity pulses were
documented upon achievement of hemostasis. The procedure was well
tolerated and no early complications were observed. The patient was
transferred to the holding area to lay flat for 2 hours.
IMPRESSION: 1. Normal diagnostic cerebral angiogram, without evidence of
intracranial aneurysm, arteriovenous malformation, or high-flow
fistula to explain the spontaneous intraventricular hemorrhage.

The preliminary results of this procedure were shared with the
patient and the patient's family.

## 2019-05-17 ENCOUNTER — Other Ambulatory Visit: Payer: Self-pay | Admitting: Cardiology

## 2019-05-17 DIAGNOSIS — Z20822 Contact with and (suspected) exposure to covid-19: Secondary | ICD-10-CM

## 2019-05-18 LAB — NOVEL CORONAVIRUS, NAA: SARS-CoV-2, NAA: DETECTED — AB

## 2019-05-20 ENCOUNTER — Telehealth: Payer: Self-pay | Admitting: Physician Assistant

## 2019-05-20 ENCOUNTER — Telehealth: Payer: Self-pay | Admitting: Adult Health

## 2019-05-20 NOTE — Telephone Encounter (Signed)
Called and left message that pt has a positive covid test and this was the last time we would attempt to call    Cline Crock PA-C  [facility]

## 2019-05-20 NOTE — Telephone Encounter (Signed)
Called and LMOM that I am calling to review her COVID results and see how she is feeling.    Lillard Anes, NP

## 2022-02-13 ENCOUNTER — Other Ambulatory Visit: Payer: Self-pay

## 2022-02-13 ENCOUNTER — Encounter (HOSPITAL_COMMUNITY): Payer: Self-pay | Admitting: Emergency Medicine

## 2022-02-13 ENCOUNTER — Emergency Department (HOSPITAL_COMMUNITY)
Admission: EM | Admit: 2022-02-13 | Discharge: 2022-02-13 | Disposition: A | Discharge status: Home / Self Care | Payer: Self-pay | Attending: Emergency Medicine | Admitting: Emergency Medicine

## 2022-02-13 DIAGNOSIS — L02426 Furuncle of left lower limb: Secondary | ICD-10-CM | POA: Insufficient documentation

## 2022-02-13 DIAGNOSIS — L0292 Furuncle, unspecified: Secondary | ICD-10-CM

## 2022-02-13 MED ORDER — LIDOCAINE-EPINEPHRINE (PF) 2 %-1:200000 IJ SOLN
10.0000 mL | Freq: Once | INTRAMUSCULAR | Status: AC
  Administered 2022-02-13: 10 mL
  Filled 2022-02-13: qty 20

## 2022-02-13 MED ORDER — DOXYCYCLINE HYCLATE 100 MG PO TABS
100.0000 mg | ORAL_TABLET | Freq: Once | ORAL | Status: AC
  Administered 2022-02-13: 100 mg via ORAL
  Filled 2022-02-13: qty 1

## 2022-02-13 MED ORDER — NAPROXEN 375 MG PO TABS: 375.0000 mg | ORAL_TABLET | Freq: Two times a day (BID) | ORAL | 0 refills | Status: DC

## 2022-02-13 MED ORDER — DOXYCYCLINE HYCLATE 100 MG PO TABS: 100.0000 mg | ORAL_TABLET | Freq: Two times a day (BID) | ORAL | 0 refills | Status: AC

## 2022-02-13 NOTE — Discharge Instructions (Signed)
Take the antibiotics as prescribed.  Apply warm compresses and hot soaks in the tub.  Follow-up with a primary care doctor to be rechecked.  Return to the ED for worsening symptoms.

## 2022-02-13 NOTE — ED Triage Notes (Signed)
Pt reported to ED with c/o recurrent cyst like areas that frequently affect groin and under breasts. Pt states areas have been developing more frequently and expresses pain to rt groin.

## 2022-02-13 NOTE — ED Provider Notes (Signed)
[facility] EMERGENCY DEPARTMENT Provider Note   CSN: [id number] Arrival date & time: 02/13/22  1854     History  Chief Complaint  Patient presents with   Cyst    Emma Morrow is a 21 y.o. female.  HPI   Patient presents to the ED for evaluation of a painful skin lesion.  Patient states she gets episodes of recurrent areas of skin irritation and swelling.  She has called them states in the past.  She gets them underneath her breast as well as the groin.  She noticed this 1 in her left inner thigh couple days ago.  She feels a raised area in the skin that is tender.  She denies any fevers or chills.  Home Medications Prior to Admission medications   Medication Sig Start Date End Date Taking? Authorizing Provider  levonorgestrel-ethinyl estradiol (ALTAVERA) 0.15-30 MG-MCG tablet Take 1 tablet by mouth daily.    [provider]      Allergies    Patient has no known allergies.    Review of Systems   Review of Systems  Physical Exam Updated Vital Signs BP 127/88   Pulse (!) 108   Temp 98.6 F (37 C)   Resp 16   SpO2 100%  Physical Exam Vitals and nursing note reviewed.  Constitutional:      General: She is not in acute distress.    Appearance: She is well-developed.  HENT:     Head: Normocephalic and atraumatic.     Right Ear: External ear normal.     Left Ear: External ear normal.  Eyes:     General: No scleral icterus.       Right eye: No discharge.        Left eye: No discharge.     Conjunctiva/sclera: Conjunctivae normal.  Neck:     Trachea: No tracheal deviation.  Cardiovascular:     Rate and Rhythm: Normal rate.  Pulmonary:     Effort: Pulmonary effort is normal. No respiratory distress.     Breath sounds: No stridor.  Abdominal:     General: There is no distension.  Musculoskeletal:        General: No swelling or deformity.     Cervical back: Neck supple.  Skin:    General: Skin is warm and dry.     Findings: No rash.      Comments: Pea-sized area of swelling, slight fluctuance, tenderness left inner thigh, no induration surrounding the area, no lymphangitic streaking  Neurological:     Mental Status: She is alert.     Cranial Nerves: Cranial nerve deficit: no gross deficits.     ED Results / Procedures / Treatments   Labs (all labs ordered are listed, but only abnormal results are displayed) Labs Reviewed - No data to display  EKG None  Radiology No results found.  Procedures .Marland KitchenIncision and Drainage  Date/Time: 02/13/2022 8:33 PM  Performed by: Dorie Rank, MD Authorized by: Dorie Rank, MD   Consent:    Consent obtained:  Verbal   Consent given by:  Patient Location:    Type:  Abscess (Left inner thigh) Pre-procedure details:    Skin preparation:  Chlorhexidine Sedation:    Sedation type:  None Anesthesia:    Anesthesia method:  Local infiltration   Local anesthetic:  Lidocaine 1% WITH epi Procedure type:    Complexity:  Simple Procedure details:    Needle aspiration: yes     Needle size:  18 G  Drainage amount:  Scant   Packing materials:  None Post-procedure details:    Procedure completion:  Tolerated well, no immediate complications     Medications Ordered in ED Medications  lidocaine-EPINEPHrine (XYLOCAINE W/EPI) 2 %-1:200000 (PF) injection 10 mL (has no administration in time range)    ED Course/ Medical Decision Making/ A&P                           Medical Decision Making Problems Addressed: Furuncle: acute illness or injury  Risk Prescription drug management.   Patient with a small furuncle.  Needle aspiration performed.  Small amount of purulent drainage noted.  Will discharge home with antibiotics, warm compresses.  Evaluation and diagnostic testing in the emergency department does not suggest an emergent condition requiring admission or immediate intervention beyond what has been performed at this time.  The patient is safe for discharge and has been  instructed to return immediately for worsening symptoms, change in symptoms or any other concerns.        Final Clinical Impression(s) / ED Diagnoses Final diagnoses:  None    Rx / DC Orders ED Discharge Orders     None         Dorie Rank, MD 02/13/22 2035

## 2022-08-07 ENCOUNTER — Ambulatory Visit: Admit: 2022-08-07 | Discharge status: Against Medical Advice | Payer: Self-pay

## 2022-08-07 ENCOUNTER — Ambulatory Visit (HOSPITAL_COMMUNITY)
Admission: EM | Admit: 2022-08-07 | Discharge: 2022-08-07 | Disposition: A | Discharge status: Home / Self Care | Payer: Self-pay | Attending: Nurse Practitioner | Admitting: Nurse Practitioner

## 2022-08-07 ENCOUNTER — Encounter (HOSPITAL_COMMUNITY): Payer: Self-pay

## 2022-08-07 DIAGNOSIS — Z1152 Encounter for screening for COVID-19: Secondary | ICD-10-CM | POA: Insufficient documentation

## 2022-08-07 DIAGNOSIS — J309 Allergic rhinitis, unspecified: Secondary | ICD-10-CM | POA: Insufficient documentation

## 2022-08-07 DIAGNOSIS — N926 Irregular menstruation, unspecified: Secondary | ICD-10-CM | POA: Insufficient documentation

## 2022-08-07 DIAGNOSIS — J069 Acute upper respiratory infection, unspecified: Secondary | ICD-10-CM | POA: Insufficient documentation

## 2022-08-07 LAB — POC URINE PREG, ED: Preg Test, Ur: NEGATIVE

## 2022-08-07 MED ORDER — BENZONATATE 100 MG PO CAPS: 100.0000 mg | ORAL_CAPSULE | Freq: Three times a day (TID) | ORAL | 0 refills | Status: AC | PRN

## 2022-08-07 MED ORDER — CETIRIZINE HCL 10 MG PO TABS: 10.0000 mg | ORAL_TABLET | Freq: Every day | ORAL | 0 refills | Status: AC

## 2022-08-07 MED ORDER — PROMETHAZINE-DM 6.25-15 MG/5ML PO SYRP: 5.0000 mL | ORAL_SOLUTION | Freq: Every evening | ORAL | 0 refills | Status: AC | PRN

## 2022-08-07 MED ORDER — FLUTICASONE PROPIONATE 50 MCG/ACT NA SUSP: 1.0000 | Freq: Every day | NASAL | 0 refills | Status: AC

## 2022-08-07 NOTE — ED Triage Notes (Signed)
Pt states that she was sick starting in Nov. Sx comes and goes. cough, congestion, sinus pressure. Taken nyquil, robitussin other OTC meds with no relief. No fever

## 2022-08-07 NOTE — Discharge Instructions (Addendum)
Pregnancy test today is negative.  Start cetrizine and Flonase nasal spray for allergies (this is safe to take year round).   You have a viral upper respiratory infection.  Symptoms should improve over the next week to 10 days.  If you develop chest pain or shortness of breath, go to the emergency room.  We have tested you today for COVID-19.  You will see the results in Mychart and we will call you with positive results.  Please stay home and isolate until you are aware of the results.    Some things that can make you feel better are: - Increased rest - Increasing fluid with water/sugar free electrolytes - Acetaminophen and ibuprofen as needed for fever/pain - Salt water gargling, chloraseptic spray and throat lozenges - OTC guaifenesin (Mucinex) 600 mg twice daily - Saline sinus flushes or a neti pot - Humidifying the air -Tessalon Perles during the day as needed for dry cough and cough syrup at nighttime as needed for dry cough

## 2022-08-07 NOTE — ED Provider Notes (Signed)
[name]    CSN: [id number] Arrival date & time: 08/07/22  1039      History   Chief Complaint Chief Complaint  Patient presents with   Cough   sinus pressure   Nasal Congestion   chest congestion    HPI Emma Morrow is a 23 y.o. female.   Patient presents today with 5-day history of dry cough, shortness of breath with coughing, runny and stuffy nose, sore throat that is improved, headache, bilateral ear plugged sensation, vomiting from coughing so hard, decreased appetite, loss of taste, and decreased energy levels.  Patient denies fever, bodies or chills, congested cough, chest pain, ear drainage, abdominal pain, nausea, diarrhea.  No known sick contacts.  Has been taking NyQuil, Robitussin, and Mucinex for symptoms with minimal improvement.  Reports this seems to happen a few times per year, she gets better for 1 to 2 weeks, then starts having symptoms again.  She wonders if she may have seasonal allergies.  Patient reports last menstrual period was approximately 8-9 months ago; she think she has PCOS and has irregular periods.  She is currently sexually active.    History reviewed. No pertinent past medical history.  There are no problems to display for this patient.   Past Surgical History:  Procedure Laterality Date   IR ANGIO INTRA EXTRACRAN SEL INTERNAL CAROTID BILAT MOD SED  06/10/2018   IR ANGIO VERTEBRAL SEL VERTEBRAL BILAT MOD SED  06/10/2018   IR US GUIDE VASC ACCESS RIGHT  06/10/2018   IR US GUIDE VASC ACCESS RIGHT  06/10/2018    OB History   No obstetric history on file.      Home Medications    Prior to Admission medications   Medication Sig Start Date End Date Taking? Authorizing Provider  benzonatate (TESSALON) 100 MG capsule Take 1 capsule (100 mg total) by mouth 3 (three) times daily as needed for cough. Do not take with alcohol or while driving or operating heavy machinery.  May cause drowsiness. 08/07/22  Yes Eulogio Bear, NP   cetirizine (ZYRTEC) 10 MG tablet Take 1 tablet (10 mg total) by mouth daily. 08/07/22  Yes Eulogio Bear, NP  fluticasone (FLONASE) 50 MCG/ACT nasal spray Place 1 spray into both nostrils daily. 08/07/22  Yes Eulogio Bear, NP  promethazine-dextromethorphan (PROMETHAZINE-DM) 6.25-15 MG/5ML syrup Take 5 mLs by mouth at bedtime as needed for cough. 08/07/22  Yes Eulogio Bear, NP    Family History History reviewed. No pertinent family history.  Social History Social History   Tobacco Use   Smoking status: Never  Substance Use Topics   Alcohol use: No   Drug use: Never     Allergies   Patient has no known allergies.   Review of Systems Review of Systems Per HPI  Physical Exam Triage Vital Signs ED Triage Vitals  Enc Vitals Group     BP 08/07/22 1110 121/72     Pulse Rate 08/07/22 1109 (!) 104     Resp 08/07/22 1109 (!) 22     Temp 08/07/22 1109 98.3 F (36.8 C)     Temp Source 08/07/22 1109 Oral     SpO2 08/07/22 1109 93 %     Weight 08/07/22 1107 200 lb (90.7 kg)     Height --      Head Circumference --      Peak Flow --      Pain Score 08/07/22 1107 0     Pain Loc --  Pain Edu? --      Excl. in [place]? --    No data found.  Updated Vital Signs BP 121/72 (BP Location: Left Arm)   Pulse (!) 104   Temp 98.3 F (36.8 C) (Oral)   Resp (!) 22   Wt 200 lb (90.7 kg)   SpO2 93%   BMI 37.18 kg/m   Heart rate recheck after resting: 98 Respiratory rate after resting: 20  Visual Acuity Right Eye Distance:   Left Eye Distance:   Bilateral Distance:    Right Eye Near:   Left Eye Near:    Bilateral Near:     Physical Exam Vitals and nursing note reviewed.  Constitutional:      General: She is not in acute distress.    Appearance: Normal appearance. She is not ill-appearing or toxic-appearing.  HENT:     Head: Normocephalic and atraumatic.     Right Ear: Tympanic membrane, ear canal and external ear normal.     Left Ear: Tympanic  membrane, ear canal and external ear normal.     Nose: Congestion and rhinorrhea present.     Mouth/Throat:     Mouth: Mucous membranes are moist.     Pharynx: Oropharynx is clear. Posterior oropharyngeal erythema present. No oropharyngeal exudate.     Tonsils: No tonsillar exudate. 2+ on the right. 2+ on the left.  Eyes:     General: No scleral icterus.    Extraocular Movements: Extraocular movements intact.  Cardiovascular:     Rate and Rhythm: Normal rate and regular rhythm.  Pulmonary:     Effort: Pulmonary effort is normal. No respiratory distress.     Breath sounds: Normal breath sounds. No wheezing, rhonchi or rales.  Abdominal:     General: Abdomen is flat. Bowel sounds are normal. There is no distension.     Palpations: Abdomen is soft.     Tenderness: There is no abdominal tenderness.  Musculoskeletal:     Cervical back: Normal range of motion and neck supple.  Lymphadenopathy:     Cervical: No cervical adenopathy.  Skin:    General: Skin is warm and dry.     Coloration: Skin is not jaundiced or pale.     Findings: No erythema or rash.  Neurological:     Mental Status: She is alert and oriented to person, place, and time.  Psychiatric:        Behavior: Behavior is cooperative.      UC Treatments / Results  Labs (all labs ordered are listed, but only abnormal results are displayed) Labs Reviewed  SARS CORONAVIRUS 2 (TAT 6-24 HRS)  POC URINE PREG, ED    EKG   Radiology No results found.  Procedures Procedures (including critical care time)  Medications Ordered in UC Medications - No data to display  Initial Impression / Assessment and Plan / UC Course  I have reviewed the triage vital signs and the nursing notes.  Pertinent labs & imaging results that were available during my care of the patient were reviewed by me and considered in my medical decision making (see chart for details).   Patient is well-appearing, normotensive, afebrile, not  tachycardic, not tachypneic, oxygenating well on room air.    1. Viral URI with cough 2. Encounter for screening for COVID-19 Suspect viral etiology Vital signs and examination today are reassuring COVID-19 testing obtained Supportive care discussed Start cough suppressant ER and return precautions discussed Note given for work  3. Allergic rhinitis, unspecified seasonality,  unspecified trigger Start oral cetirizine, nasal Flonase Prescriptions given Recommended continuing through season changes if they are helpful Follow-up with PCP if no improvement  4. Irregular menses UPT today is negative  The patient was given the opportunity to ask questions.  All questions answered to their satisfaction.  The patient is in agreement to this plan.    Final Clinical Impressions(s) / UC Diagnoses   Final diagnoses:  Viral URI with cough  Encounter for screening for COVID-19  Allergic rhinitis, unspecified seasonality, unspecified trigger  Irregular menses     Discharge Instructions      Pregnancy test today is negative.  Start cetrizine and Flonase nasal spray for allergies (this is safe to take year round).   You have a viral upper respiratory infection.  Symptoms should improve over the next week to 10 days.  If you develop chest pain or shortness of breath, go to the emergency room.  We have tested you today for COVID-19.  You will see the results in Mychart and we will call you with positive results.  Please stay home and isolate until you are aware of the results.    Some things that can make you feel better are: - Increased rest - Increasing fluid with water/sugar free electrolytes - Acetaminophen and ibuprofen as needed for fever/pain - Salt water gargling, chloraseptic spray and throat lozenges - OTC guaifenesin (Mucinex) 600 mg twice daily - Saline sinus flushes or a neti pot - Humidifying the air -Tessalon Perles during the day as needed for dry cough and cough syrup  at nighttime as needed for dry cough     ED Prescriptions     Medication Sig Dispense Auth. Provider   benzonatate (TESSALON) 100 MG capsule Take 1 capsule (100 mg total) by mouth 3 (three) times daily as needed for cough. Do not take with alcohol or while driving or operating heavy machinery.  May cause drowsiness. 21 capsule Noemi Chapel A, NP   promethazine-dextromethorphan (PROMETHAZINE-DM) 6.25-15 MG/5ML syrup Take 5 mLs by mouth at bedtime as needed for cough. 118 mL Noemi Chapel A, NP   cetirizine (ZYRTEC) 10 MG tablet Take 1 tablet (10 mg total) by mouth daily. 30 tablet Noemi Chapel A, NP   fluticasone (FLONASE) 50 MCG/ACT nasal spray Place 1 spray into both nostrils daily. 16 g Eulogio Bear, NP      PDMP not reviewed this encounter.   Eulogio Bear, NP 08/07/22 1228

## 2022-08-08 LAB — SARS CORONAVIRUS 2 (TAT 6-24 HRS): SARS Coronavirus 2: NEGATIVE
